# Patient Record
Sex: Male | Born: 1970 | Race: Black or African American | Marital: Single | Smoking: Former smoker
Health system: Southern US, Community
[De-identification: ages and names within clinical notes are randomized; demographics above are authoritative.]

## PROBLEM LIST (undated history)

## (undated) HISTORY — PX: ABDOMINAL SURGERY: SHX537

---

## 2002-09-07 ENCOUNTER — Emergency Department (HOSPITAL_COMMUNITY): Admission: EM | Admit: 2002-09-07 | Discharge: 2002-09-07 | Payer: Self-pay | Admitting: Emergency Medicine

## 2002-10-18 ENCOUNTER — Emergency Department (HOSPITAL_COMMUNITY): Admission: EM | Admit: 2002-10-18 | Discharge: 2002-10-19 | Payer: Self-pay | Admitting: Emergency Medicine

## 2002-10-18 ENCOUNTER — Encounter: Payer: Self-pay | Admitting: Emergency Medicine

## 2003-02-08 ENCOUNTER — Emergency Department (HOSPITAL_COMMUNITY): Admission: EM | Admit: 2003-02-08 | Discharge: 2003-02-09 | Payer: Self-pay | Admitting: Emergency Medicine

## 2003-02-08 ENCOUNTER — Encounter: Payer: Self-pay | Admitting: Emergency Medicine

## 2016-05-16 ENCOUNTER — Encounter (HOSPITAL_BASED_OUTPATIENT_CLINIC_OR_DEPARTMENT_OTHER): Payer: Self-pay | Admitting: Emergency Medicine

## 2016-05-16 ENCOUNTER — Emergency Department (HOSPITAL_BASED_OUTPATIENT_CLINIC_OR_DEPARTMENT_OTHER): Payer: No Typology Code available for payment source

## 2016-05-16 ENCOUNTER — Emergency Department (HOSPITAL_BASED_OUTPATIENT_CLINIC_OR_DEPARTMENT_OTHER)
Admission: EM | Admit: 2016-05-16 | Discharge: 2016-05-16 | Disposition: A | Discharge status: Home / Self Care | Payer: No Typology Code available for payment source | Attending: Emergency Medicine | Admitting: Emergency Medicine

## 2016-05-16 DIAGNOSIS — Y939 Activity, unspecified: Secondary | ICD-10-CM | POA: Insufficient documentation

## 2016-05-16 DIAGNOSIS — F17210 Nicotine dependence, cigarettes, uncomplicated: Secondary | ICD-10-CM | POA: Insufficient documentation

## 2016-05-16 DIAGNOSIS — S39012A Strain of muscle, fascia and tendon of lower back, initial encounter: Secondary | ICD-10-CM

## 2016-05-16 DIAGNOSIS — Y92410 Unspecified street and highway as the place of occurrence of the external cause: Secondary | ICD-10-CM | POA: Diagnosis not present

## 2016-05-16 DIAGNOSIS — Y999 Unspecified external cause status: Secondary | ICD-10-CM | POA: Diagnosis not present

## 2016-05-16 DIAGNOSIS — S3992XA Unspecified injury of lower back, initial encounter: Secondary | ICD-10-CM | POA: Diagnosis present

## 2016-05-16 MED ORDER — CYCLOBENZAPRINE HCL 10 MG PO TABS: 10.0000 mg | ORAL_TABLET | Freq: Two times a day (BID) | ORAL | 0 refills | Status: DC | PRN
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
## 2016-05-16 NOTE — Discharge Instructions (Signed)
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
## 2017-12-06 ENCOUNTER — Emergency Department (HOSPITAL_COMMUNITY)
Admission: EM | Admit: 2017-12-06 | Discharge: 2017-12-06 | Disposition: A | Discharge status: Home / Self Care | Payer: BLUE CROSS/BLUE SHIELD | Attending: Emergency Medicine | Admitting: Emergency Medicine

## 2017-12-06 ENCOUNTER — Encounter (HOSPITAL_COMMUNITY): Payer: Self-pay | Admitting: *Deleted

## 2017-12-06 DIAGNOSIS — Y999 Unspecified external cause status: Secondary | ICD-10-CM | POA: Diagnosis not present

## 2017-12-06 DIAGNOSIS — S3992XA Unspecified injury of lower back, initial encounter: Secondary | ICD-10-CM | POA: Diagnosis present

## 2017-12-06 DIAGNOSIS — Y939 Activity, unspecified: Secondary | ICD-10-CM | POA: Insufficient documentation

## 2017-12-06 DIAGNOSIS — Y929 Unspecified place or not applicable: Secondary | ICD-10-CM | POA: Diagnosis not present

## 2017-12-06 DIAGNOSIS — S39012A Strain of muscle, fascia and tendon of lower back, initial encounter: Secondary | ICD-10-CM | POA: Diagnosis not present

## 2017-12-06 DIAGNOSIS — T148XXA Other injury of unspecified body region, initial encounter: Secondary | ICD-10-CM

## 2017-12-06 DIAGNOSIS — F17210 Nicotine dependence, cigarettes, uncomplicated: Secondary | ICD-10-CM | POA: Diagnosis not present

## 2017-12-06 MED ORDER — CYCLOBENZAPRINE HCL 10 MG PO TABS: 10.0000 mg | ORAL_TABLET | Freq: Two times a day (BID) | ORAL | 0 refills | Status: DC | PRN
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
## 2017-12-06 NOTE — Discharge Instructions (Addendum)
Please read instructions below. Apply ice to your areas of pain for 20 minutes at a time. You can also apply heat if this provides more relief. You can take 600 mg of Advil/ibuprofen every 6 hours as needed for pain. You can take flexeril every 12 hours as needed for muscle spasm. Be aware this medication can make you drowsy. Schedule an appointment with your primary care provider to follow up on your visit today. Return to the ER for severely worsening headache, vision changes, if new numbness or tingling in your arms or legs, inability to urinate, inability to hold your bowels, or weakness in your extremities.

## 2018-01-05 ENCOUNTER — Encounter (HOSPITAL_COMMUNITY): Payer: Self-pay | Admitting: Emergency Medicine

## 2018-01-05 ENCOUNTER — Emergency Department (HOSPITAL_COMMUNITY)
Admission: EM | Admit: 2018-01-05 | Discharge: 2018-01-05 | Disposition: A | Discharge status: Home / Self Care | Payer: BLUE CROSS/BLUE SHIELD | Attending: Emergency Medicine | Admitting: Emergency Medicine

## 2018-01-05 ENCOUNTER — Other Ambulatory Visit: Payer: Self-pay

## 2018-01-05 DIAGNOSIS — M545 Low back pain, unspecified: Secondary | ICD-10-CM

## 2018-01-05 DIAGNOSIS — F17210 Nicotine dependence, cigarettes, uncomplicated: Secondary | ICD-10-CM | POA: Diagnosis not present

## 2018-01-05 LAB — URINALYSIS, ROUTINE W REFLEX MICROSCOPIC
Bilirubin Urine: NEGATIVE
Glucose, UA: NEGATIVE mg/dL
Hgb urine dipstick: NEGATIVE
Ketones, ur: NEGATIVE mg/dL
Leukocytes, UA: NEGATIVE
Nitrite: NEGATIVE
Protein, ur: NEGATIVE mg/dL
Specific Gravity, Urine: 1.021 (ref 1.005–1.030)
pH: 5 (ref 5.0–8.0)

## 2018-01-05 MED ORDER — LIDOCAINE 5 % EX PTCH: 1.0000 | MEDICATED_PATCH | CUTANEOUS | 0 refills | Status: AC

## 2018-01-05 MED ORDER — METHOCARBAMOL 500 MG PO TABS: 500.0000 mg | ORAL_TABLET | Freq: Two times a day (BID) | ORAL | 0 refills | Status: AC
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
## 2018-01-05 NOTE — ED Notes (Signed)
Pt verbalizes understanding of d/c instructions. Pt received prescriptions. Pt ambulatory at d/c with all belongings.  

## 2018-01-05 NOTE — Discharge Instructions (Signed)
Take it easy, but do not lay around too much as this may make any stiffness worse.  °Antiinflammatory medications: Take 600 mg of ibuprofen every 6 hours or 440 mg (over the counter dose) to 500 mg (prescription dose) of naproxen every 12 hours for the next 3 days. After this time, these medications may be used as needed for pain. Take these medications with food to avoid upset stomach. Choose only one of these medications, do not take them together.  °Tylenol: Should you continue to have additional pain while taking the ibuprofen or naproxen, you may add in tylenol as needed. Your daily total maximum amount of tylenol from all sources should be limited to 4000mg/day for persons without liver problems, or 2000mg/day for those with liver problems. °Muscle relaxer: Robaxin is a muscle relaxer and may help loosen stiff muscles. Do not take the Robaxin while driving or performing other dangerous activities.  °Lidocaine patches: These are available via either prescription or over-the-counter. The over-the-counter option may be more economical one and are likely just as effective. There are multiple over-the-counter brands, such as Salonpas. °Exercises: Be sure to perform the attached exercises starting with three times a week and working up to performing them daily. This is an essential part of preventing long term problems.  ° °Follow up with a primary care provider for any future management of these complaints. °

## 2018-01-05 NOTE — ED Notes (Signed)
See EDP assessment 

## 2021-03-23 ENCOUNTER — Emergency Department (HOSPITAL_COMMUNITY)
Admission: EM | Admit: 2021-03-23 | Discharge: 2021-03-24 | Disposition: A | Discharge status: Home / Self Care | Payer: BLUE CROSS/BLUE SHIELD | Attending: Emergency Medicine | Admitting: Emergency Medicine

## 2021-03-23 ENCOUNTER — Emergency Department (HOSPITAL_COMMUNITY): Payer: BLUE CROSS/BLUE SHIELD

## 2021-03-23 ENCOUNTER — Other Ambulatory Visit: Payer: Self-pay

## 2021-03-23 DIAGNOSIS — Y990 Civilian activity done for income or pay: Secondary | ICD-10-CM | POA: Diagnosis not present

## 2021-03-23 DIAGNOSIS — W25XXXA Contact with sharp glass, initial encounter: Secondary | ICD-10-CM | POA: Insufficient documentation

## 2021-03-23 DIAGNOSIS — W450XXA Nail entering through skin, initial encounter: Secondary | ICD-10-CM

## 2021-03-23 DIAGNOSIS — S61313A Laceration without foreign body of left middle finger with damage to nail, initial encounter: Secondary | ICD-10-CM | POA: Insufficient documentation

## 2021-03-23 DIAGNOSIS — Z23 Encounter for immunization: Secondary | ICD-10-CM | POA: Insufficient documentation

## 2021-03-23 DIAGNOSIS — F17210 Nicotine dependence, cigarettes, uncomplicated: Secondary | ICD-10-CM | POA: Diagnosis not present

## 2021-03-23 DIAGNOSIS — S62663B Nondisplaced fracture of distal phalanx of left middle finger, initial encounter for open fracture: Secondary | ICD-10-CM

## 2021-03-23 DIAGNOSIS — S6992XA Unspecified injury of left wrist, hand and finger(s), initial encounter: Secondary | ICD-10-CM | POA: Diagnosis present

## 2021-03-23 MED ORDER — IBUPROFEN 400 MG PO TABS
400.0000 mg | ORAL_TABLET | Freq: Once | ORAL | Status: AC | PRN
  Administered 2021-03-23: 400 mg via ORAL
  Filled 2021-03-23: qty 1

## 2021-03-23 MED ORDER — TETANUS-DIPHTH-ACELL PERTUSSIS 5-2.5-18.5 LF-MCG/0.5 IM SUSY
0.5000 mL | PREFILLED_SYRINGE | Freq: Once | INTRAMUSCULAR | Status: AC
  Administered 2021-03-24: 0.5 mL via INTRAMUSCULAR
  Filled 2021-03-23: qty 0.5

## 2021-03-23 NOTE — ED Provider Notes (Signed)
Emergency Medicine Provider Triage Evaluation Note  William Jarvis , a 50 y.o. male  was evaluated in triage.  Pt complains of laceration of his left middle finger, happened while he was using a table saw, he cut the distal end of his middle finger and avulsed the nail bed.  Still has full sensation in his finger, can move at all joints.  Cannot remember when his last tetanus shot was, not immunocompromise.  Patient is right-handed..  Review of Systems  Positive: Finger laceration, pain Negative: Paresthesias or weakness  Physical Exam  BP (!) 138/96 (BP Location: Left Arm)   Pulse 70   Temp 98.4 F (36.9 C) (Oral)   Resp 16   SpO2 99%  Gen:   Awake, no distress   Resp:  Normal effort  MSK:   Moves extremities without difficulty  Other:    Medical Decision Making  Medically screening exam initiated at 8:03 PM.  Appropriate orders placed.  Judy Pimple was informed that the remainder of the evaluation will be completed by another provider, this initial triage assessment does not replace that evaluation, and the importance of remaining in the ED until their evaluation is complete.  Presents with laceration of his left middle finger patient need further work-up.   Carroll Sage, PA-C 03/23/21 Hardie Shackleton, MD 03/24/21 1409
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
## 2021-03-24 ENCOUNTER — Encounter (HOSPITAL_COMMUNITY): Payer: Self-pay | Admitting: Emergency Medicine

## 2021-03-24 MED ORDER — MELOXICAM 15 MG PO TABS: 15.0000 mg | ORAL_TABLET | Freq: Every day | ORAL | 0 refills | Status: AC

## 2021-03-24 MED ORDER — CEPHALEXIN 500 MG PO CAPS: 500.0000 mg | ORAL_CAPSULE | Freq: Four times a day (QID) | ORAL | 0 refills | Status: AC

## 2021-03-24 MED ORDER — KETOROLAC TROMETHAMINE 60 MG/2ML IM SOLN
30.0000 mg | Freq: Once | INTRAMUSCULAR | Status: AC
  Administered 2021-03-24: 30 mg via INTRAMUSCULAR
  Filled 2021-03-24: qty 2

## 2021-03-24 MED ORDER — DOXYCYCLINE HYCLATE 100 MG PO TABS
100.0000 mg | ORAL_TABLET | Freq: Once | ORAL | Status: AC
  Administered 2021-03-24: 100 mg via ORAL
  Filled 2021-03-24: qty 1

## 2021-03-24 MED ORDER — DOXYCYCLINE HYCLATE 100 MG PO CAPS: 100.0000 mg | ORAL_CAPSULE | Freq: Two times a day (BID) | ORAL | 0 refills | Status: AC

## 2021-03-24 MED ORDER — CEPHALEXIN 250 MG PO CAPS
500.0000 mg | ORAL_CAPSULE | Freq: Once | ORAL | Status: AC
  Administered 2021-03-24: 500 mg via ORAL
  Filled 2021-03-24: qty 2

## 2021-03-24 NOTE — ED Provider Notes (Signed)
[facility] EMERGENCY DEPARTMENT Provider Note   CSN: [id number] Arrival date & time: 03/23/21  1927     History Chief Complaint  Patient presents with   Laceration    William Jarvis is a 50 y.o. male.  The history is provided by the patient.  Laceration Location:  Finger Finger laceration location:  L middle finger Length:  2 Depth:  Through underlying tissue Quality comment:  Gouge and avulsed Bleeding: controlled   Time since incident:  10 hours Injury mechanism: table saw. Pain details:    Quality:  Aching   Severity:  Severe   Timing:  Constant   Progression:  Unchanged Foreign body present:  No foreign bodies Relieved by:  Nothing Worsened by:  Nothing Ineffective treatments:  None tried Tetanus status: updated in triage. Associated symptoms: no fever       History reviewed. No pertinent past medical history.  There are no problems to display for this patient.   Past Surgical History:  Procedure Laterality Date   ABDOMINAL SURGERY         History reviewed. No pertinent family history.  Social History   Tobacco Use   Smoking status: Every Day    Packs/day: 1.00    Types: Cigarettes   Smokeless tobacco: Never  Substance Use Topics   Alcohol use: Yes    Home Medications Prior to Admission medications   Medication Sig Start Date End Date Taking? Authorizing Provider  lidocaine (LIDODERM) 5 % Place 1 patch onto the skin daily. Remove & Discard patch within 12 hours or as directed by MD 01/05/18   Joy, Shawn C, PA-C  methocarbamol (ROBAXIN) 500 MG tablet Take 1 tablet (500 mg total) by mouth 2 (two) times daily. 01/05/18   Joy, Shawn C, PA-C    Allergies    Shellfish allergy and Tuberculin tests  Review of Systems   Review of Systems  Constitutional:  Negative for fever.  HENT:  Negative for facial swelling.   Eyes:  Negative for redness.  Respiratory:  Negative for wheezing.   Cardiovascular:  Negative for leg swelling.   Gastrointestinal:  Negative for vomiting.  Genitourinary:  Negative for difficulty urinating.  Musculoskeletal:  Negative for neck stiffness.  Skin:  Positive for wound.  Neurological:  Negative for facial asymmetry.  Psychiatric/Behavioral:  Negative for agitation.   All other systems reviewed and are negative.  Physical Exam Updated Vital Signs BP 134/88   Pulse 72   Temp 98.4 F (36.9 C) (Oral)   Resp 16   SpO2 100%   Physical Exam Vitals and nursing note reviewed.  Constitutional:      General: He is not in acute distress.    Appearance: Normal appearance.  HENT:     Head: Normocephalic and atraumatic.     Nose: Nose normal.  Eyes:     Extraocular Movements: Extraocular movements intact.  Cardiovascular:     Rate and Rhythm: Normal rate and regular rhythm.     Pulses: Normal pulses.     Heart sounds: Normal heart sounds.  Pulmonary:     Effort: Pulmonary effort is normal.     Breath sounds: Normal breath sounds.  Abdominal:     General: Abdomen is flat. Bowel sounds are normal.     Palpations: Abdomen is soft.     Tenderness: There is no abdominal tenderness. There is no guarding.  Musculoskeletal:     Left hand: Laceration present. Normal range of motion. Normal capillary refill.  Cervical back: Normal range of motion and neck supple.  Skin:    Capillary Refill: Capillary refill takes less than 2 seconds.     Coloration: Skin is not pale.       Neurological:     General: No focal deficit present.     Mental Status: He is alert and oriented to person, place, and time.     Deep Tendon Reflexes: Reflexes normal.  Psychiatric:        Mood and Affect: Mood normal.        Behavior: Behavior normal.       ED Results / Procedures / Treatments   Labs (all labs ordered are listed, but only abnormal results are displayed) Labs Reviewed - No data to display  EKG None  Radiology DG Finger Middle Left  Result Date: 03/23/2021 CLINICAL DATA:   50 year old male with laceration of the third digit of the left hand. EXAM: LEFT MIDDLE FINGER 2+V COMPARISON:  None. FINDINGS: Tiny radiopaque densities adjacent to the tip of the tuft of the distal phalanx of the third digit may represent small cortical fragment versus overlying skin injury. No other fracture. There is no dislocation. The bones are well mineralized. There is laceration of the soft tissues of the tip of the third digit. No radiopaque foreign object. IMPRESSION: Possible tiny cortical fractures of the tuft of the distal phalanx of the third digit. No other acute fracture. Electronically Signed   By: Elgie Collard M.D.   On: 03/23/2021 20:25    Procedures Procedures   Medications Ordered in ED Medications  Tdap (BOOSTRIX) injection 0.5 mL (has no administration in time range)  ibuprofen (ADVIL) tablet 400 mg (400 mg Oral Given 03/23/21 2014)    ED Course  I have reviewed the triage vital signs and the nursing notes.  Pertinent labs & imaging results that were available during my care of the patient were reviewed by me and considered in my medical decision making (see chart for details).  327 case d/w Dr. Dion Saucier on call hand surgery: irrigate well, xeroform, gauze and splint and PO antibiotics and follow up in the office.     Irrigated copious with 1 liter of saline and iodine, xeroform and then bulky dressing and splint applied.    William Jarvis was evaluated in Emergency Department on 03/24/2021 for the symptoms described in the history of present illness. He was evaluated in the context of the global COVID-19 pandemic, which necessitated consideration that the patient might be at risk for infection with the SARS-CoV-2 virus that causes COVID-19. Institutional protocols and algorithms that pertain to the evaluation of patients at risk for COVID-19 are in a state of rapid change based on information released by regulatory bodies including the CDC and federal and state  organizations. These policies and algorithms were followed during the patient's care in the ED.  Final Clinical Impression(s) / ED Diagnoses Final diagnoses:  None   Return for intractable cough, coughing up blood, fevers > 100.4 unrelieved by medication, shortness of breath, intractable vomiting, chest pain, shortness of breath, weakness, numbness, changes in speech, facial asymmetry, abdominal pain, passing out, Inability to tolerate liquids or food, cough, altered mental status or any concerns. No signs of systemic illness or infection. The patient is nontoxic-appearing on exam and vital signs are within normal limits. I have reviewed the triage vital signs and the nursing notes. Pertinent labs & imaging results that were available during my care of the patient were reviewed by  me and considered in my medical decision making (see chart for details). After history, exam, and medical workup I feel the patient has been appropriately medically screened and is safe for discharge home. Pertinent diagnoses were discussed with the patient. Patient was given return precautions. Luberta Robertson, Akeira Lahm, MD 03/24/21 [phone]

## 2021-03-24 NOTE — ED Notes (Signed)
Finger laceration irrigated with saline and iodine. Zeroform and kerlex used to dress wound. Splint applied.

## 2022-06-21 ENCOUNTER — Other Ambulatory Visit (HOSPITAL_BASED_OUTPATIENT_CLINIC_OR_DEPARTMENT_OTHER): Payer: Self-pay

## 2022-06-21 ENCOUNTER — Encounter (HOSPITAL_BASED_OUTPATIENT_CLINIC_OR_DEPARTMENT_OTHER): Payer: Self-pay

## 2022-06-21 ENCOUNTER — Other Ambulatory Visit: Payer: Self-pay

## 2022-06-21 ENCOUNTER — Emergency Department (HOSPITAL_BASED_OUTPATIENT_CLINIC_OR_DEPARTMENT_OTHER)
Admission: EM | Admit: 2022-06-21 | Discharge: 2022-06-21 | Disposition: A | Discharge status: Home / Self Care | Payer: BLUE CROSS/BLUE SHIELD | Attending: Emergency Medicine | Admitting: Emergency Medicine

## 2022-06-21 DIAGNOSIS — K0381 Cracked tooth: Secondary | ICD-10-CM | POA: Insufficient documentation

## 2022-06-21 DIAGNOSIS — K0889 Other specified disorders of teeth and supporting structures: Secondary | ICD-10-CM

## 2022-06-21 MED ORDER — AMOXICILLIN 500 MG PO CAPS
500.0000 mg | ORAL_CAPSULE | Freq: Three times a day (TID) | ORAL | 0 refills | Status: AC
  Filled 2022-06-21: qty 21, 7d supply, fill #0

## 2022-06-21 MED ORDER — HYDROCODONE-ACETAMINOPHEN 5-325 MG PO TABS
2.0000 | ORAL_TABLET | Freq: Once | ORAL | Status: AC
  Administered 2022-06-21: 2 via ORAL
  Filled 2022-06-21: qty 2

## 2022-06-21 MED ORDER — HYDROCODONE-ACETAMINOPHEN 5-325 MG PO TABS
1.0000 | ORAL_TABLET | Freq: Four times a day (QID) | ORAL | 0 refills | Status: AC | PRN
  Filled 2022-06-21: qty 15, 2d supply, fill #0

## 2022-06-21 NOTE — ED Provider Notes (Signed)
[facility] EMERGENCY DEPARTMENT Provider Note   CSN: [id number] Arrival date & time: 06/21/22  1251     History  Chief Complaint  Patient presents with   Oral Swelling    William Jarvis is a 51 y.o. male.  Patient complains of left-sided facial swelling.  Patient states he has a broken left molar and that he began to have increased pain yesterday along with left-sided facial swelling.  He denies any fevers, nausea, vomiting.  He has not had dental care for this tooth.  He is concerned about possible infection along with pain.  No relevant past medical history on file  HPI     Home Medications Prior to Admission medications   Medication Sig Start Date End Date Taking? Authorizing Provider  amoxicillin (AMOXIL) 500 MG capsule Take 1 capsule (500 mg total) by mouth 3 (three) times daily. 06/21/22  Yes Dorothyann Peng, PA-C  HYDROcodone-acetaminophen (NORCO/VICODIN) 5-325 MG tablet Take 1-2 tablets by mouth every 6 (six) hours as needed. 06/21/22  Yes Dorothyann Peng, PA-C  cephALEXin (KEFLEX) 500 MG capsule Take 1 capsule (500 mg total) by mouth 4 (four) times daily. 03/24/21   Palumbo, April, MD  doxycycline (VIBRAMYCIN) 100 MG capsule Take 1 capsule (100 mg total) by mouth 2 (two) times daily. One po bid x 7 days 03/24/21   Palumbo, April, MD  lidocaine (LIDODERM) 5 % Place 1 patch onto the skin daily. Remove & Discard patch within 12 hours or as directed by MD 01/05/18   Joy, Helane Gunther, PA-C  meloxicam (MOBIC) 15 MG tablet Take 1 tablet (15 mg total) by mouth daily. 03/24/21   Palumbo, April, MD  methocarbamol (ROBAXIN) 500 MG tablet Take 1 tablet (500 mg total) by mouth 2 (two) times daily. 01/05/18   Joy, Shawn C, PA-C      Allergies    Fish allergy, Shellfish allergy, Shellfish-derived products, and Tuberculin tests    Review of Systems   Review of Systems  HENT:  Positive for dental problem and facial swelling.     Physical Exam Updated Vital Signs BP (!) 132/92  (BP Location: Left Arm)   Pulse 91   Temp 98.4 F (36.9 C) (Oral)   Resp 18   Ht 5\' 11"  (1.803 m)   Wt 99.8 kg   SpO2 97%   BMI 30.68 kg/m  Physical Exam HENT:     Head: Normocephalic and atraumatic.     Mouth/Throat:     Mouth: Mucous membranes are moist.     Dentition: Dental tenderness present. No dental abscesses.     Tongue: Tongue does not deviate from midline.     Palate: No mass.     Pharynx: Uvula midline. No uvula swelling.     Tonsils: No tonsillar abscesses.   Eyes:     Pupils: Pupils are equal, round, and reactive to light.  Pulmonary:     Effort: Pulmonary effort is normal. No respiratory distress.  Musculoskeletal:        General: No signs of injury.     Cervical back: Normal range of motion.  Skin:    General: Skin is dry.  Neurological:     Mental Status: He is alert.  Psychiatric:        Speech: Speech normal.        Behavior: Behavior normal.     ED Results / Procedures / Treatments   Labs (all labs ordered are listed, but only abnormal results are displayed) Labs  Reviewed - No data to display  EKG None  Radiology No results found.  Procedures Procedures    Medications Ordered in ED Medications  HYDROcodone-acetaminophen (NORCO/VICODIN) 5-325 MG per tablet 2 tablet (has no administration in time range)    ED Course/ Medical Decision Making/ A&P                           Medical Decision Making  This patient presents to the ED for concern of left-sided dental pain and swelling, this involves an extensive number of treatment options, and is a complaint that carries with it a high risk of complications and morbidity.  The differential diagnosis includes broken tooth, dental abscess, peritonsillar abscess, Ludwig's angina, and others   Co morbidities that complicate the patient evaluation  History of dental problems   Additional history obtained:   External records from outside source obtained and reviewed including n/a   Lab  Tests:  I Ordered, and personally interpreted labs.  The pertinent results include: No indication at this time for laboratory testing   Imaging Studies ordered:  I considered imaging of the face but no frank abscess is apparent.  I see no signs of peritonsillar or retropharyngeal abscess at this time.   Problem List / ED Course / Critical interventions / Medication management   I ordered medication including hydrocodone for pain Reevaluation of the patient after these medicines showed that the patient improved I have reviewed the patients home medicines and have made adjustments as needed   Social Determinants of Health:  Patient currently has no dental care   Test / Admission - Considered:  There is no indication at this time for admission.  Patient prescribed pain medication and antibiotic for possible infection coverage.  I see no signs of PTA or retropharyngeal abscess.  Voice is normal.  Patient is not having a hard time swallowing.  I see no drainable abscess and appreciate no fluctuance at this time.  This seems like a dental injury/broken tooth due to likely poor dentition.  Patient's daughter is currently calling dentist in the area to try to find an appointment.  I have also provided contact information for the on-call dentist to be used as needed.  Patient understands instructions.  Discharge home.        Final Clinical Impression(s) / ED Diagnoses Final diagnoses:  Pain, dental    Rx / DC Orders ED Discharge Orders          Ordered    HYDROcodone-acetaminophen (NORCO/VICODIN) 5-325 MG tablet  Every 6 hours PRN        06/21/22 1557    amoxicillin (AMOXIL) 500 MG capsule  3 times daily        06/21/22 1557              Ronny Bacon 06/21/22 1559    Tegeler, Gwenyth Allegra, MD 06/21/22 2228

## 2022-06-21 NOTE — ED Triage Notes (Signed)
Reports broke tooth awhile ago and was given amoxicillin but didn't finish entire course.  Reports a few days ago started having left facial swelling and pain.  No dentist at this time

## 2022-06-21 NOTE — Discharge Instructions (Addendum)
You were seen today for dental pain.  I have prescribed an antibiotic to help cover for potential infection.  I also prescribed pain medication that should get you through the next 2 to 3 days.  Please follow-up as soon as possible with dentistry for possible extraction and definitive care.  I have provided contact information for the on-call dentist to be used as needed.

## 2022-10-15 IMAGING — DX DG FINGER MIDDLE 2+V*L*
3 series · 3 of 3 positions shown · non-contrast
Comparison: None.

CLINICAL DATA: 50-year-old male with laceration of the third digit
of the left hand.

EXAM:
LEFT MIDDLE FINGER 2+V

[finger ap]
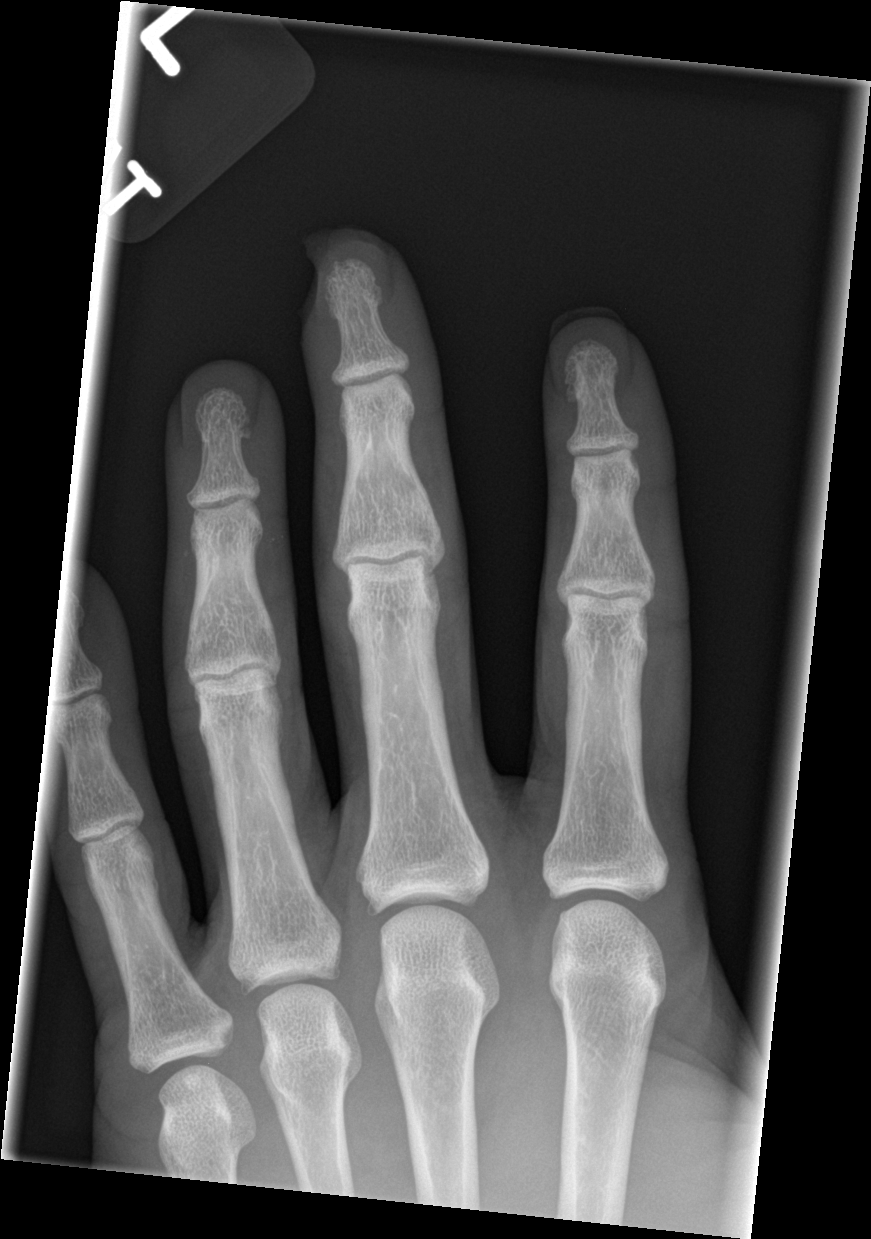

[finger obl]
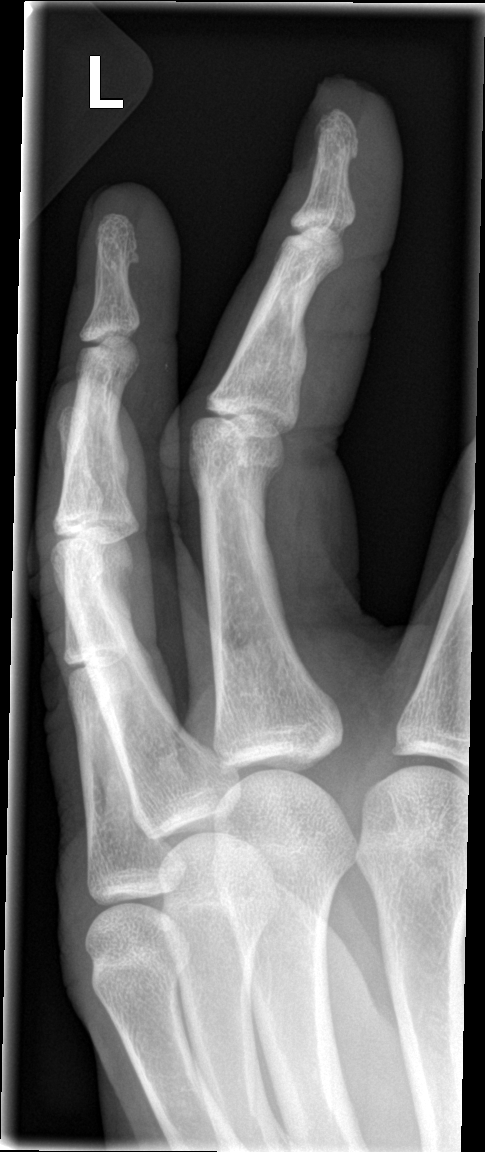

[finger lat]
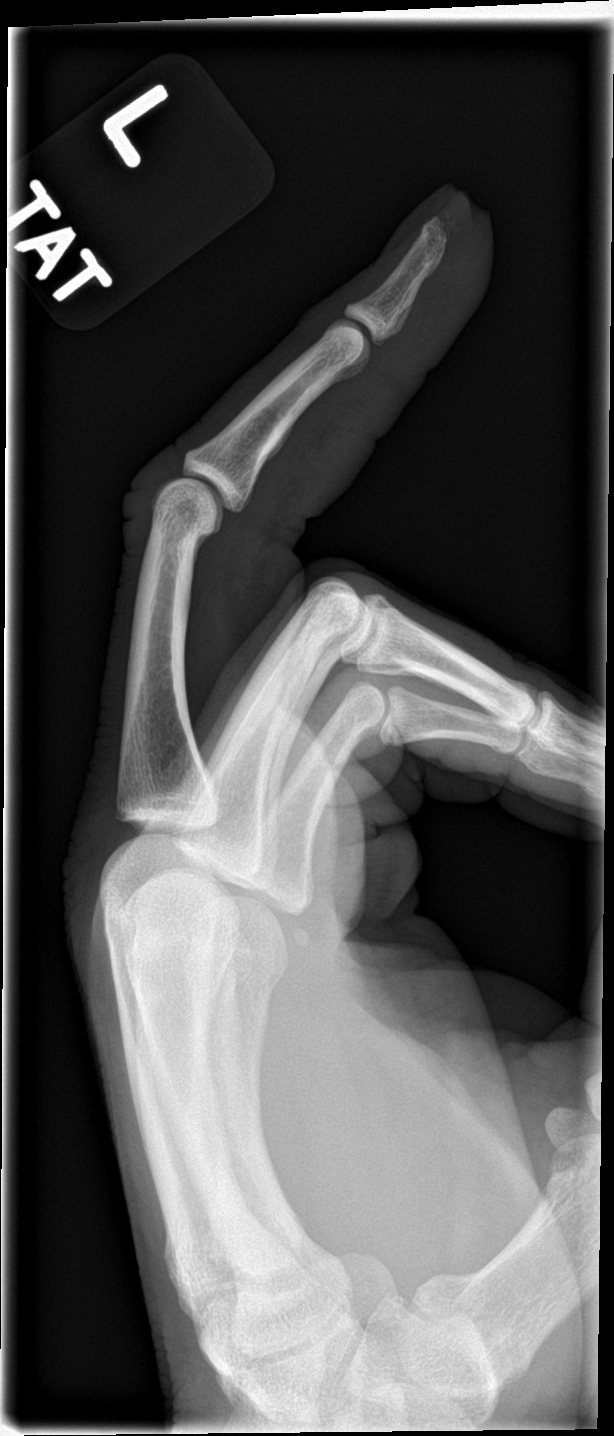

[3 of 3 positions shown; findings below may reference images not displayed]

FINDINGS: Tiny radiopaque densities adjacent to the tip of the tuft of the
distal phalanx of the third digit may represent small cortical
fragment versus overlying skin injury. No other fracture. There is
no dislocation. The bones are well mineralized. There is laceration
of the soft tissues of the tip of the third digit. No radiopaque
foreign object.
IMPRESSION: Possible tiny cortical fractures of the tuft of the distal phalanx
of the third digit. No other acute fracture.

## 2022-12-31 DIAGNOSIS — M542 Cervicalgia: Secondary | ICD-10-CM | POA: Diagnosis not present

## 2022-12-31 DIAGNOSIS — Y92410 Unspecified street and highway as the place of occurrence of the external cause: Secondary | ICD-10-CM | POA: Diagnosis not present

## 2022-12-31 DIAGNOSIS — M19012 Primary osteoarthritis, left shoulder: Secondary | ICD-10-CM | POA: Diagnosis not present

## 2022-12-31 DIAGNOSIS — M25512 Pain in left shoulder: Secondary | ICD-10-CM | POA: Diagnosis not present

## 2022-12-31 DIAGNOSIS — M545 Low back pain, unspecified: Secondary | ICD-10-CM | POA: Diagnosis not present

## 2023-06-18 DIAGNOSIS — W133XXA Fall through floor, initial encounter: Secondary | ICD-10-CM | POA: Diagnosis not present

## 2023-06-18 DIAGNOSIS — Z556 Problems related to health literacy: Secondary | ICD-10-CM | POA: Diagnosis not present

## 2023-06-18 DIAGNOSIS — W2209XA Striking against other stationary object, initial encounter: Secondary | ICD-10-CM | POA: Diagnosis not present

## 2023-06-18 DIAGNOSIS — Y9389 Activity, other specified: Secondary | ICD-10-CM | POA: Diagnosis not present

## 2023-06-18 DIAGNOSIS — S299XXA Unspecified injury of thorax, initial encounter: Secondary | ICD-10-CM | POA: Diagnosis not present

## 2023-06-18 DIAGNOSIS — R0781 Pleurodynia: Secondary | ICD-10-CM | POA: Diagnosis not present

## 2023-06-18 DIAGNOSIS — R071 Chest pain on breathing: Secondary | ICD-10-CM | POA: Diagnosis not present

## 2023-06-18 DIAGNOSIS — S2232XA Fracture of one rib, left side, initial encounter for closed fracture: Secondary | ICD-10-CM | POA: Diagnosis not present

## 2023-08-26 ENCOUNTER — Other Ambulatory Visit (HOSPITAL_COMMUNITY): Payer: Self-pay
